# Patient Record
Sex: Male | Born: 2000 | Hispanic: No
Health system: Southern US, Community
[De-identification: ages and names within clinical notes are randomized; demographics above are authoritative.]

---
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
## 2020-08-24 ENCOUNTER — Other Ambulatory Visit: Payer: 59

## 2020-08-24 DIAGNOSIS — Z20822 Contact with and (suspected) exposure to covid-19: Secondary | ICD-10-CM

## 2020-08-27 LAB — NOVEL CORONAVIRUS, NAA
